# Patient Record
Sex: Male | Born: 1960 | Race: White | Hispanic: No | Marital: Married
Health system: Midwestern US, Academic
[De-identification: ages and names within clinical notes are randomized; demographics above are authoritative.]

---

## 2020-06-26 IMAGING — CR PELVIS
5 series · 5 of 5 positions shown · non-contrast
Comparison: none

[l-spine ap]
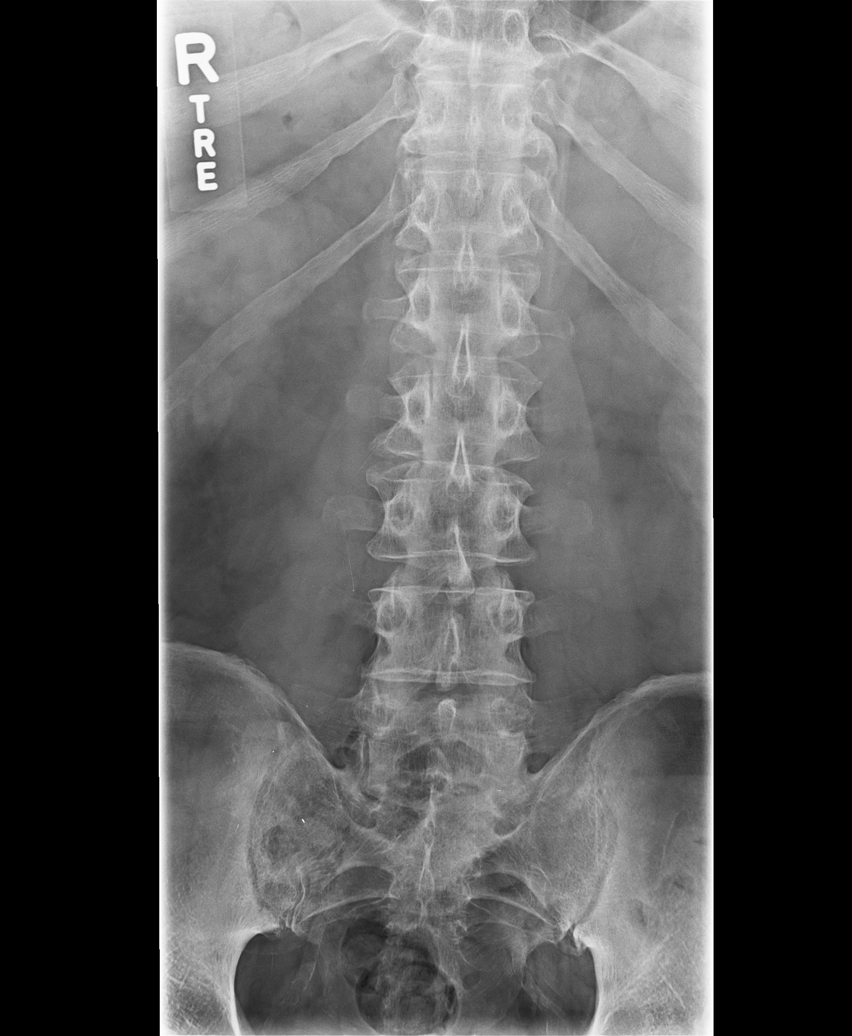

[l-spine obl (1 of 2)]
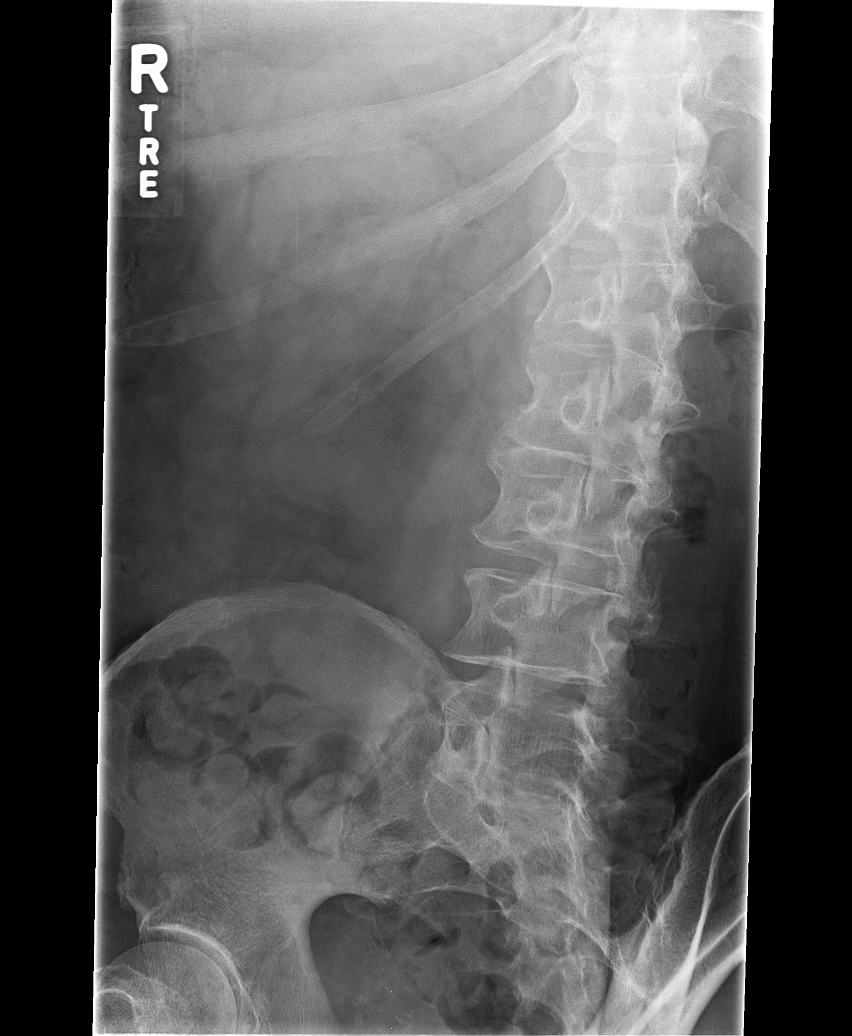

[l-spine obl (2 of 2)]
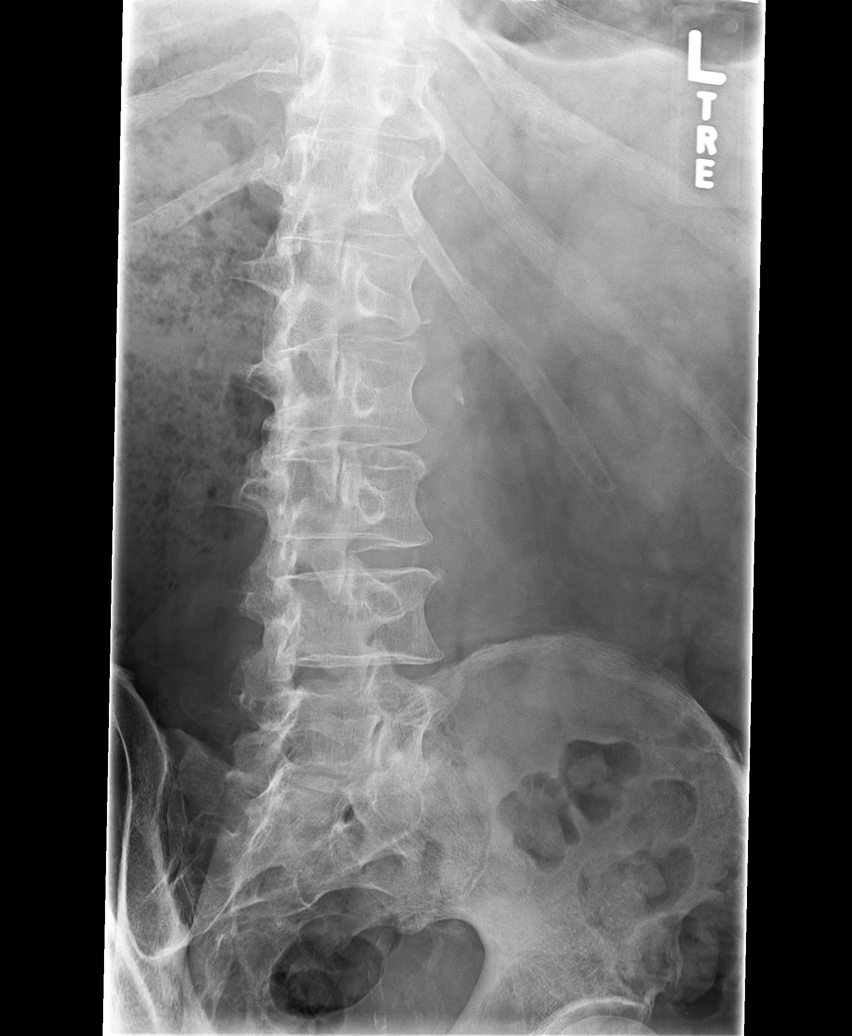

[l-spine lat]
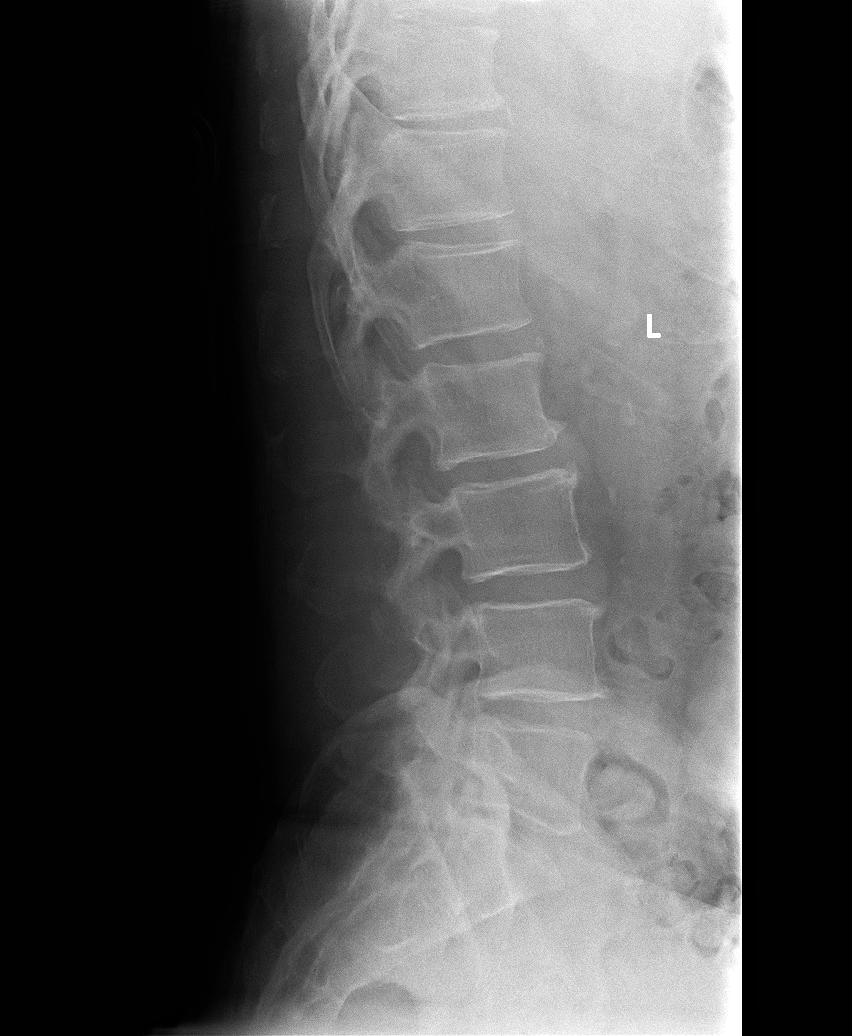

[l-spine l5-s1]
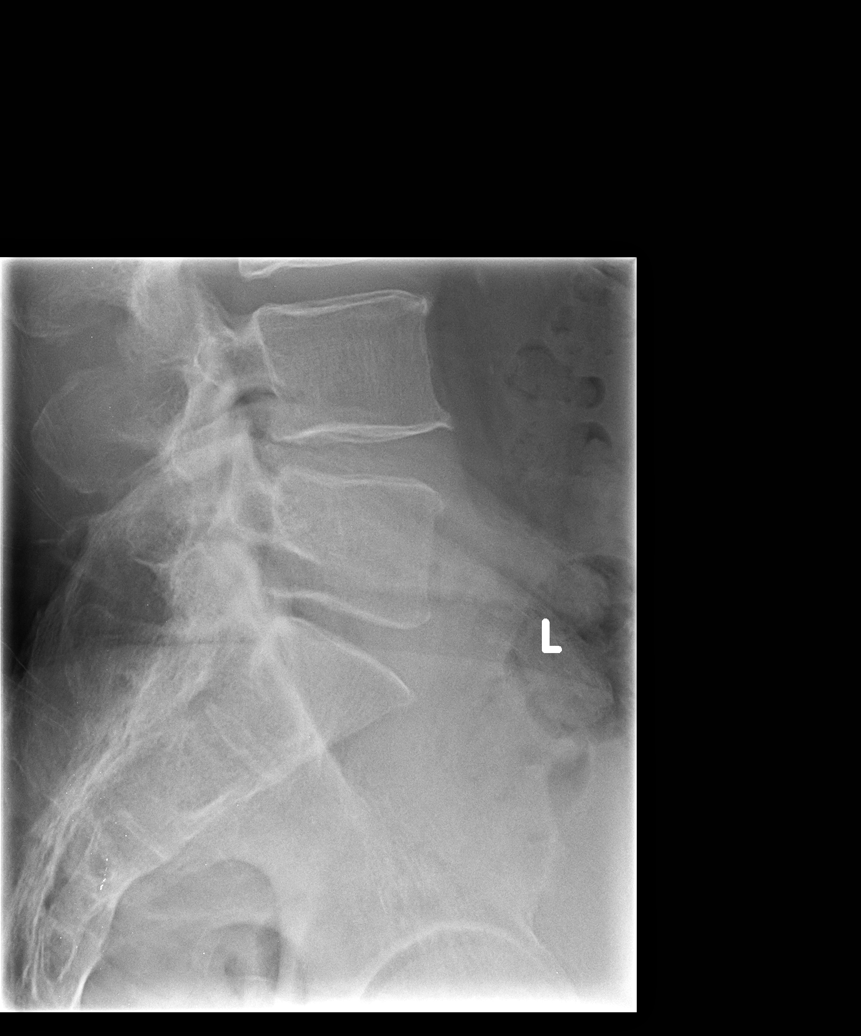

[5 of 5 positions shown; findings below may reference images not displayed]

EXAM

Lumbar spine series

INDICATION

low back pain
LBP W/O INJURY. TB

FINDINGS

Five views of the lumbar spine were obtained, including obliques.

Lumbar vertebral body heights and alignment are normal.

There is mild disc space narrowing at L5-S1.

There are small osteophytes of vertebral body endplates of L 2 through L5.

There is mild-to-moderate narrowing of the facet joint spaces bilaterally, most notably at L4-5 and

IMPRESSION

There is multilevel lumbar spine spondylosis. No acute appearing abnormality is identified.

Tech Notes:

LBP W/O INJURY. TB

## 2020-06-29 IMAGING — MR L-spine^Routine
4 series · 25 of 25 positions shown · non-contrast
Comparison: none

[Series 2: T2 · sagittal · 4.0mm · 0.62mm/px · 4 of 4 slices shown (1 of 2)]
[im 1/4]
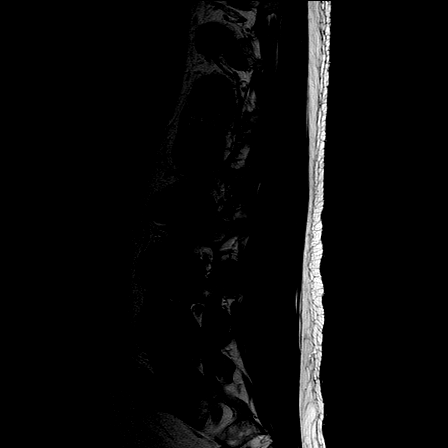
[im 2/4]
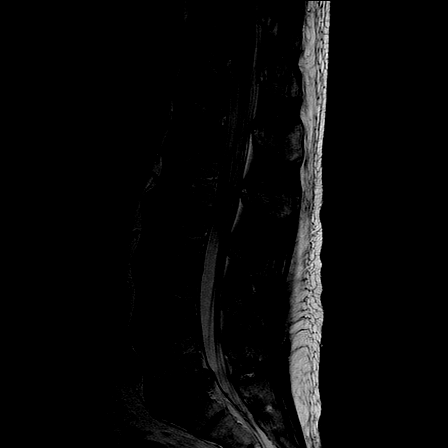
[im 3/4]
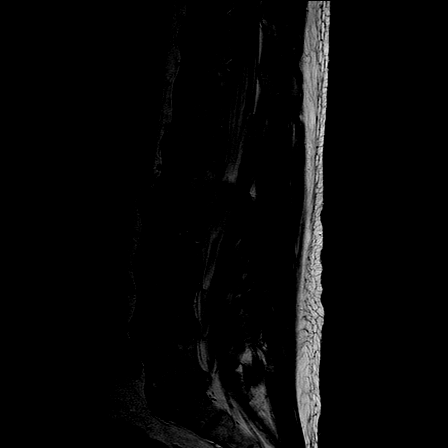
[im 4/4]
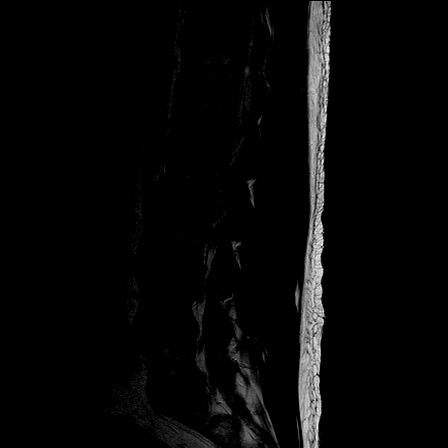

[Series 3: T1 · sagittal · 4.0mm · 0.73mm/px · 5 of 5 slices shown (1 of 2)]
[im 1/5]
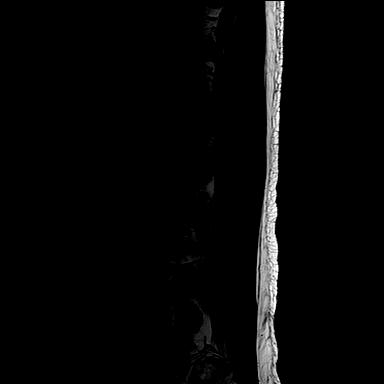
[im 2/5]
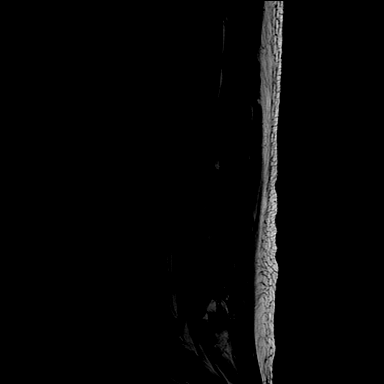
[im 3/5]
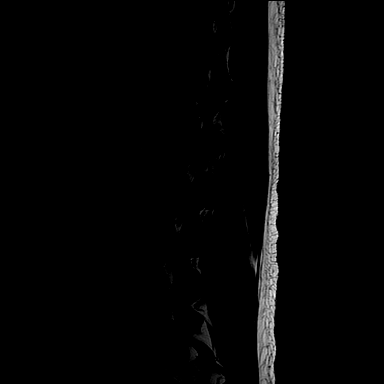
[im 4/5]
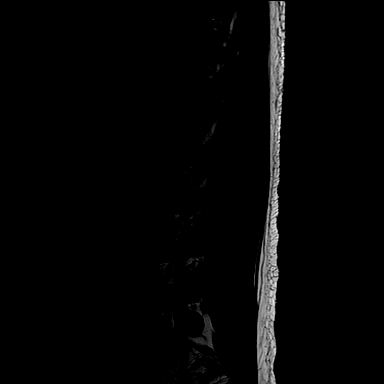
[im 5/5]
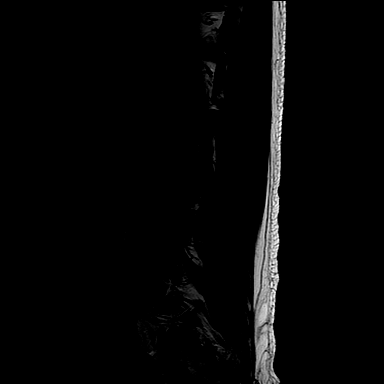

[Series 5: T2 · axial · 4.5mm · 0.49mm/px · z∈[-160,+1]mm · 10 of 10 slices shown (2 of 2)]
[im 1/10]
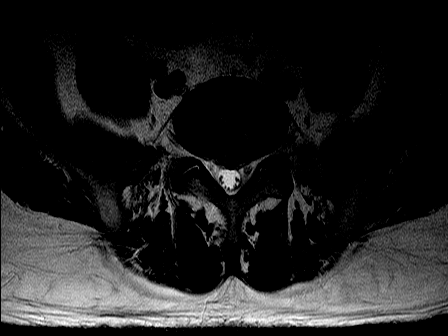
[im 2/10]
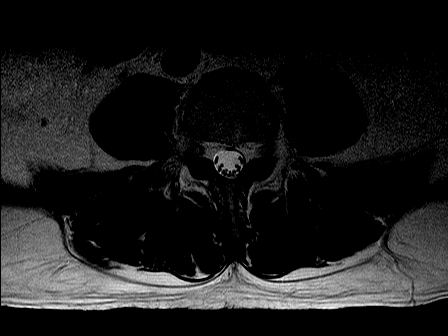
[im 3/10]
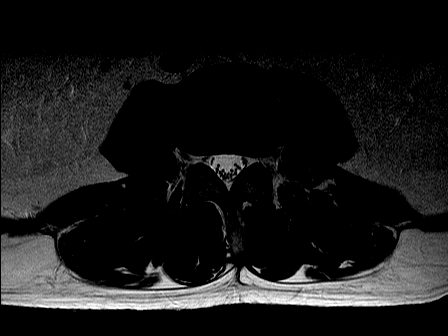
[im 4/10]
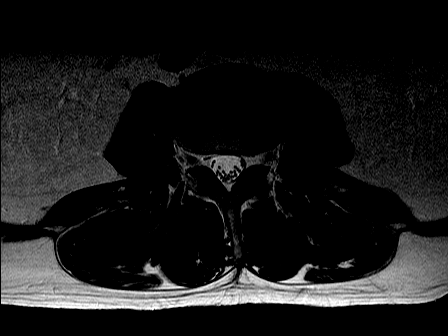
[im 5/10]
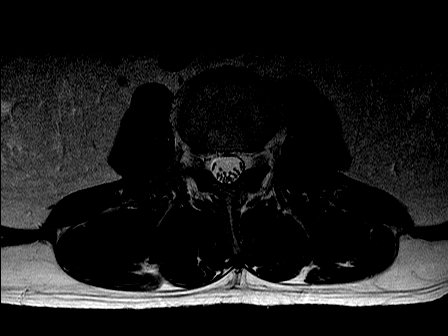
[im 6/10]
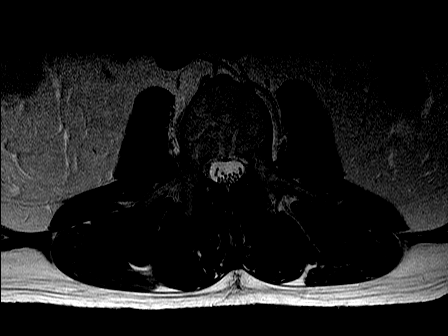
[im 7/10]
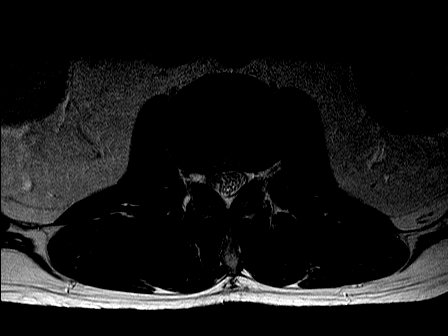
[im 8/10]
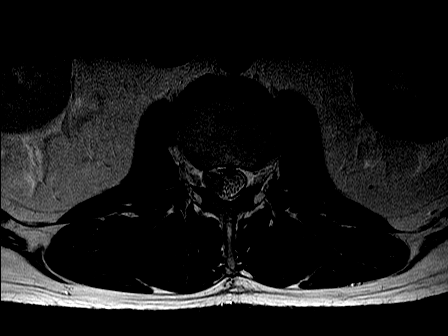
[im 9/10]
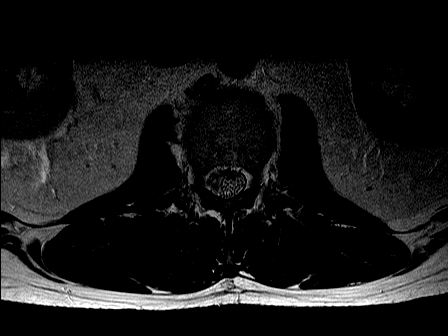
[im 10/10]
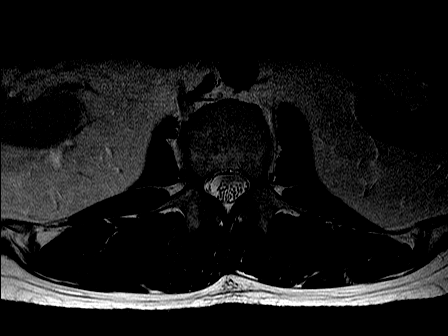

[Series 6: T1 · axial · 4.5mm · 0.86mm/px · z∈[-155,+24]mm · 6 of 6 slices shown (2 of 2)]
[im 1/6]
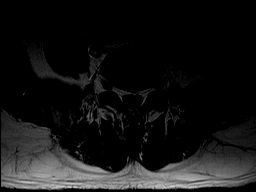
[im 2/6]
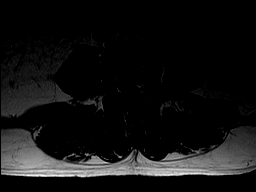
[im 3/6]
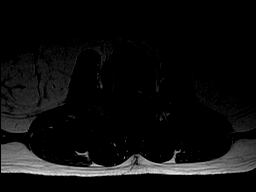
[im 4/6]
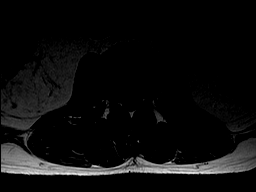
[im 5/6]
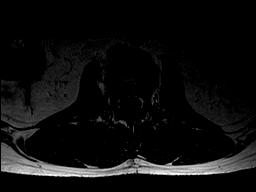
[im 6/6]
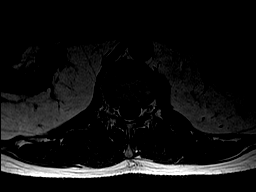

[25 of 25 positions shown; findings below may reference images not displayed]

DIAGNOSTIC STUDIES

EXAM

MRI lumbar spine without contrast.

INDICATION

spinal stenosis
LOW BACK PAIN WITH RADIATION DOWN RT LEG, NUMBNESS TO RT KNEE.  RG

TECHNIQUE

Sagittal axial images were obtained with variable T1 and T2 weighting.

COMPARISONS

None available

FINDINGS

Conus medullaris is normal in signal intensity and location. The intervertebral discs at T12-L1 and
L1-2 are normal. There is disc bulging at L2-3 without central canal or neural foraminal stenosis.

Just above the L2-3 disc level there is a right-sided soft tissue density best seen on image 8
series 2. This measures 1.4 cm. This probably is a extradural and may reflect migrated free disc
fragment versus nerve sheath tumor. Repeat exam with contrast is recommended.

L3-4: Slight disc bulging is noted without central canal or neural foraminal stenosis.

L4-5: There is minimal disc bulging and facet hypertrophy without central canal or neural foraminal
stenosis.

L5-S1: Small posterior midline disc protrusion is seen. This is largely confined to the ventral
epidural fat. Neural foramina well preserved.

IMPRESSION

1.4 cm right sided soft tissue density just above the disc level of L2-3. This likely is extradural
and may reflect a large free disc fragment versus nerve sheath type tumor. Repeat MRI with
gadolinium is suggested.

Small posterior midline disc protrusion at L5-S1 without significant central canal or neural
foraminal stenosis.

Tech Notes:

LOW BACK PAIN WITH RADIATION DOWN RT LEG, NUMBNESS TO RT KNEE.  RG

## 2020-07-03 ENCOUNTER — Encounter: Admit: 2020-07-03 | Discharge: 2020-07-03 | Payer: PRIVATE HEALTH INSURANCE

## 2020-07-05 ENCOUNTER — Encounter: Admit: 2020-07-05 | Discharge: 2020-07-05 | Payer: PRIVATE HEALTH INSURANCE

## 2020-07-05 NOTE — Telephone Encounter
Navigation Intake Assessment Document    Patient Name:  Larry Leblanc  DOB:  [birth year]  Insurance:  [employer]     Appointment Info:   Future Appointments   Date Time Provider Department Center   07/12/2020 10:30 AM Pablo Penrose, MD [facility]     Diagnosis & Reason for Visit:  Lumbar mass    Physician Info:    Referring Physician:  Dr Jhonnie Garner    Contact Name & Number:  [phone], [place]    Neurology:  Dr Christin Bach, [facility] Oncologist:  Does not have.    Radiation Oncologist:  Does not have.     PCP:  Dr Christin Bach    Location of Films:  PACS    Location of Pathology:  No biopsy     History of Present Illness:    60 year old male who initially presented with low back pain.  Pain is sharp and constant with numbness down right leg, starting late April.   He is having difficulty walking, but able to walk some once medications started.      Previous injections into back many ears ago.    PE: Weakness in the FHL and loss of reflex in the right ankle.     06/26/20 Lumbar spine series:  Multilevel lumbar spine spondylosis.  No acute abnormality.   06/29/20 MRI:  Mass 1.4 cm  in the right side tissue density just above the disc level of L2-3     Prior Treatment (XRT, Surgery, Chemotherapy):  Pain med    Allergies reviewed and verified with the patient, and documented in Epic:  Yes    PMH: HTN, controlled; DM2, not controlled; on BLOOD THINNER;  Blurred vision resulting in CVA work up (FEB) tonsillectomy ([facility])    Never smoker  Wt 88.5 kg    NEEDS Assessment:    Genetic Counseling:                Nutrition:          Social & Financial:             Spiritual & Emotional:          Physical:          Communication:          Onc Fertility:           Additional Education:         Patient Education                      Topics Discussed               Education Details                  Additional Education Comments

## 2020-07-06 ENCOUNTER — Encounter: Admit: 2020-07-06 | Discharge: 2020-07-06 | Payer: PRIVATE HEALTH INSURANCE

## 2020-07-06 DIAGNOSIS — R69 Illness, unspecified: Secondary | ICD-10-CM

## 2020-07-07 IMAGING — MR L-spine^LUMBAR BLOCK
5 series · 42 of 48 positions shown · IV contrast (with contrast)
Comparison: none

[Series 2: T2 · sagittal · 4.0mm · 0.62mm/px · 7 of 9 slices shown (1 of 2)]
[im 1/9]
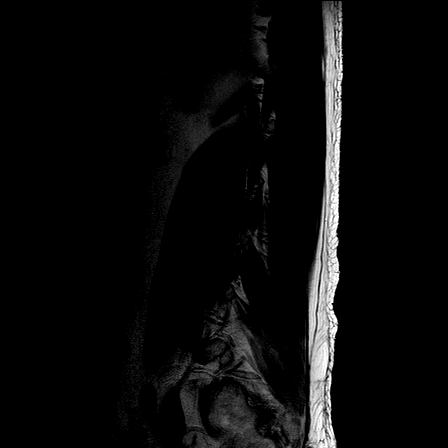
[im 2/9]
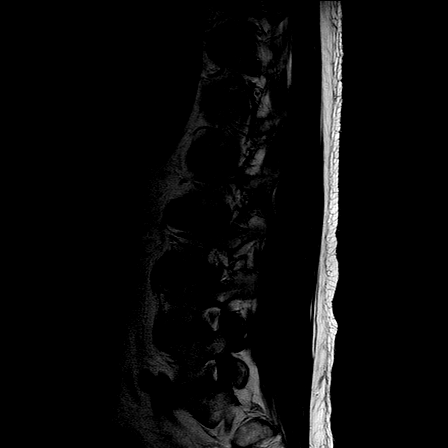
[im 3/9]
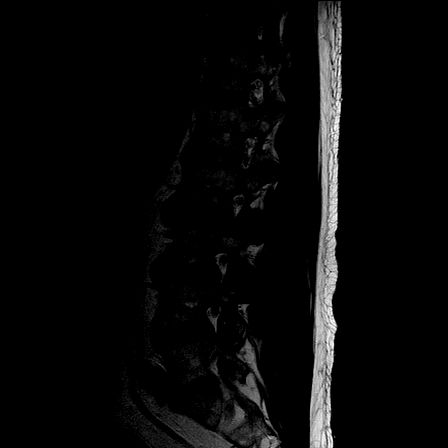
[im 5/9]
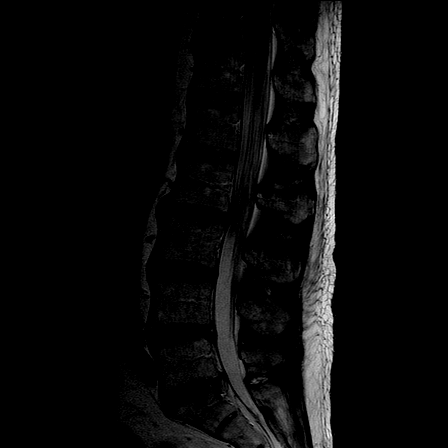
[im 6/9]
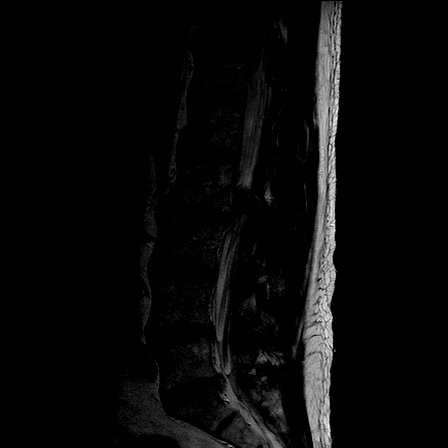
[im 7/9]
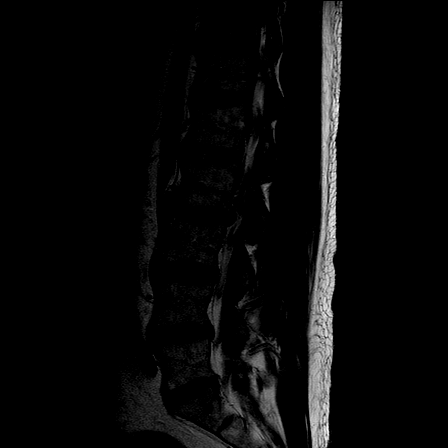
[im 9/9]
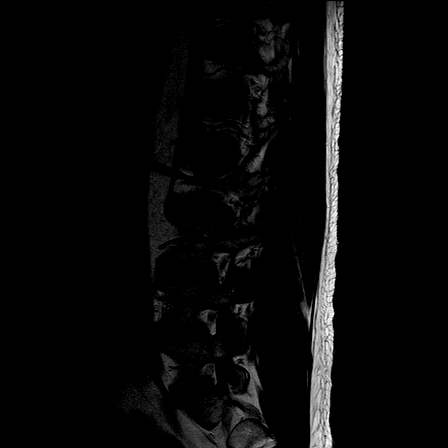

[Series 5: T2 · axial · 4.5mm · 0.49mm/px · z∈[-196,-8]mm · 14 of 20 slices shown (2 of 2)]
[im 1/20]
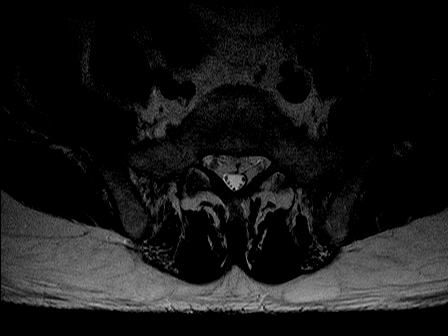
[im 2/20]
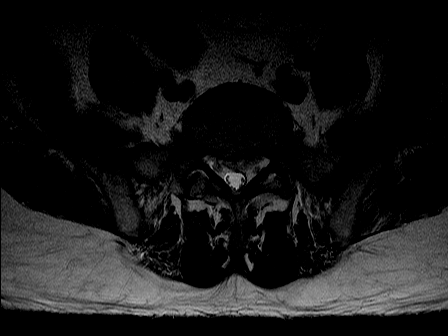
[im 3/20]
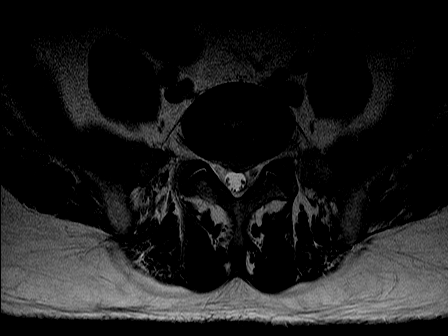
[im 5/20]
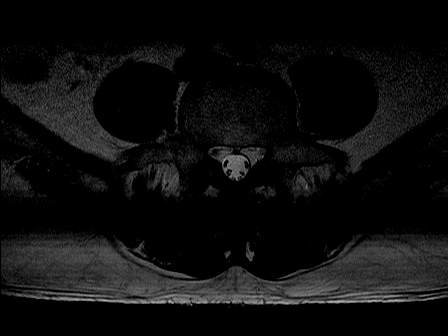
[im 6/20]
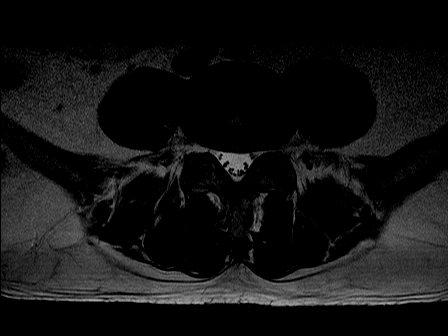
[im 8/20]
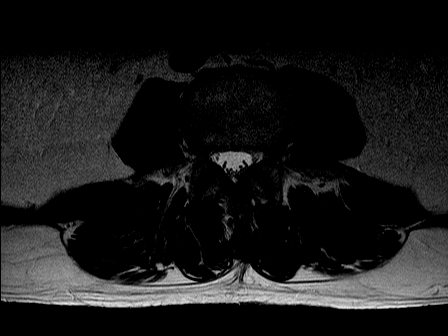
[im 9/20]
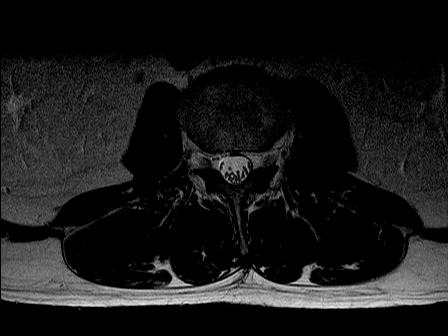
[im 11/20]
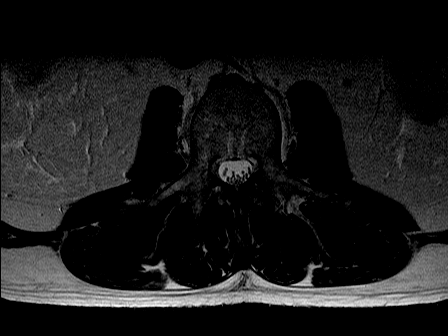
[im 12/20]
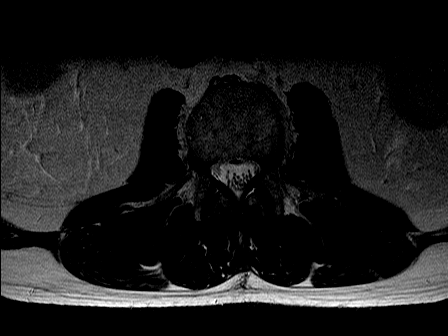
[im 14/20]
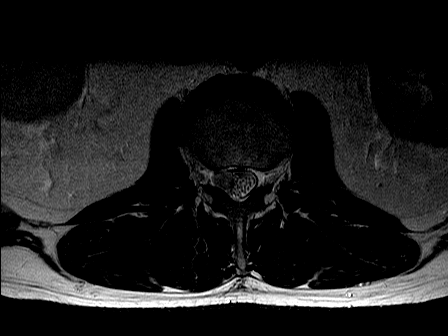
[im 15/20]
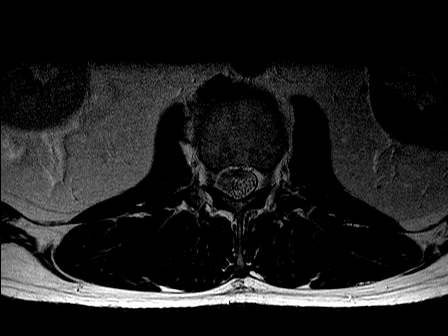
[im 17/20]
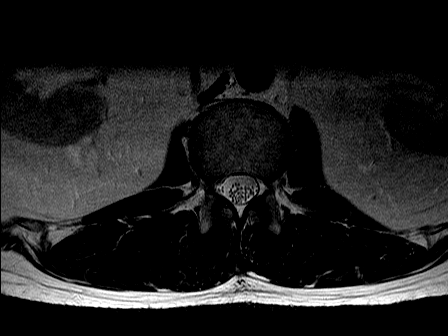
[im 18/20]
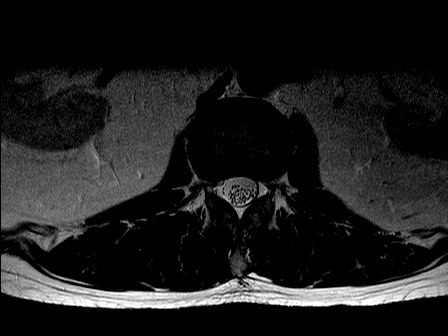
[im 20/20]
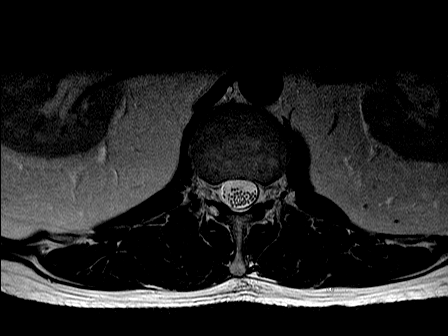

[Series 6: T1 · axial · 4.5mm · 0.86mm/px · z∈[-137,-19]mm · 6 of 9 slices shown]
[im 1/9]
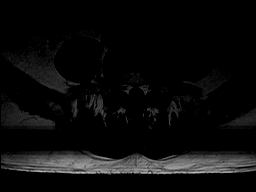
[im 2/9]
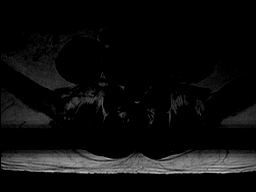
[im 4/9]
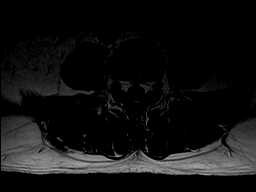
[im 5/9]
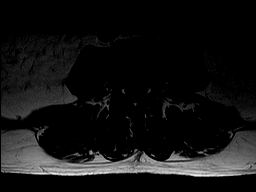
[im 7/9]
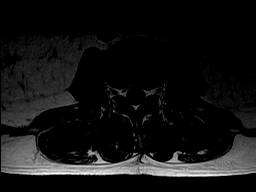
[im 9/9]
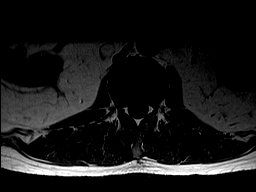

[Series 7: T1 fat-sat post-contrast · sagittal · 4.0mm · 0.88mm/px · 7 of 10 slices shown (1 of 2)]
[im 1/10]
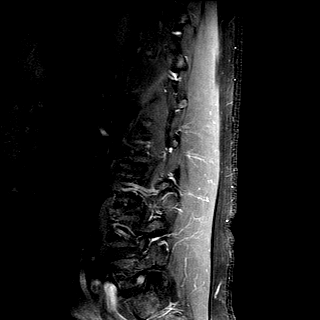
[im 2/10]
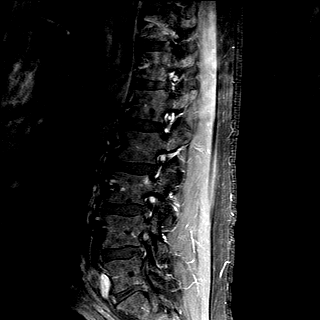
[im 4/10]
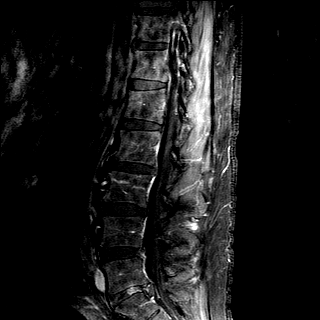
[im 5/10]
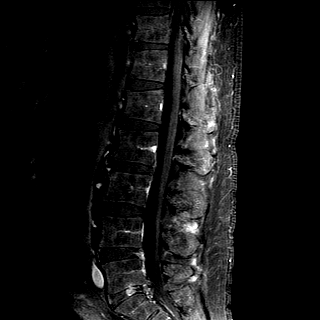
[im 7/10]
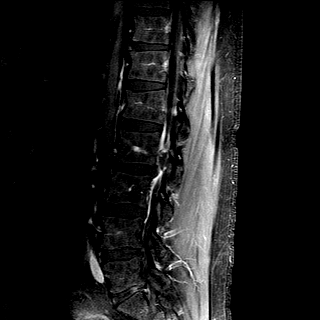
[im 8/10]
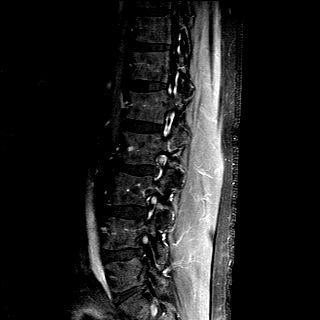
[im 10/10]
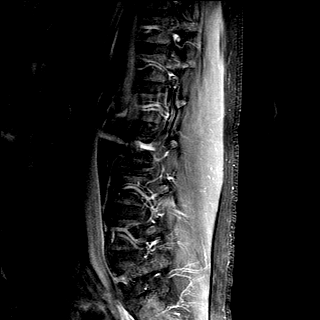

[Series 8: T1 fat-sat post-contrast · axial · 5.0mm · 0.86mm/px · z∈[-199,-6]mm · 8 of 20 slices shown (2 of 2)]
[im 1/20]
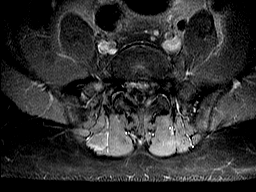
[im 3/20]
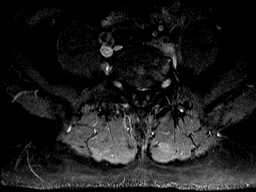
[im 6/20]
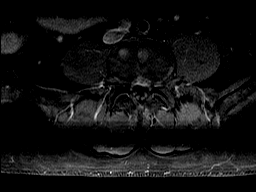
[im 9/20]
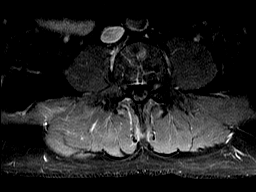
[im 11/20]
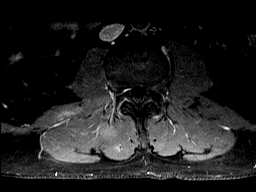
[im 14/20]
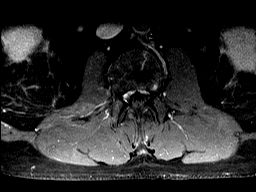
[im 17/20]
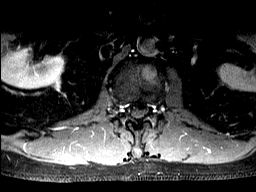
[im 20/20]
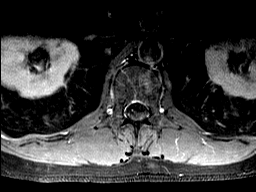

[42 of 48 positions shown; findings below may reference images not displayed]

DIAGNOSTIC STUDIES

EXAM

MRI lumbar spine with and without contrast:

INDICATION

Abnormal noncontrast MRI.

TECHNIQUE

Routine imaging sequences were obtained on a GE system. Pre and post contrast imaging was
performed.

COMPARISONS

06/29/2020.

FINDINGS

No significant scoliosis. The marrow signal is slightly heterogeneous but is likely within normal
limits. There are several small hemangiomas. No significant marrow edema. No acute displaced
fracture or dislocation. The conus is located at the level of T12/L1.

L1-L2: No significant disc bulge or protrusion.

L2-L3: Mild diffuse disc bulge with moderate loss of disc height. There is a 0.9 x 1.0 x 1.6 cm
nonenhancing mass within the right lateral recess. Severe narrowing of the right lateral recess.
Mild ligamentum flava hypertrophy. Minimal bilateral neural foramen stenosis.

L3-L4: Mild diffuse disc bulge with mild ligamentum flava hypertrophy and facet arthropathy. No
central canal stenosis. Minimal neural foramen stenosis on the left.

L4-L5: Mild diffuse disc bulge with mild ligamentum flava hypertrophy and facet arthropathy. Mild
central canal stenosis. Minimal neural foramen stenosis on the left.

L5-S1: Left paracentral focal disc protrusion with moderate central canal stenosis. There is mild
ligamentum flava hypertrophy and facet arthropathy. Minimal neural foramen stenosis on the right.

No enlarged retroperitoneal nodes. No paraspinal fluid collections. Paravertebral soft tissues are
not remarkable.

IMPRESSION

L2-L3: There is a 0.9 x 1.0 x 1.6 cm disc fragment posterior to the L2 vertebral body with severe
narrowing of the right lateral recess.

L5-S1: Left paracentral focal disc protrusion with moderate central canal stenosis.

Follow-up with a neurosurgical consultation is recommended.

Tech Notes:

FU ARE L2 FROM PREVIOUS MRI.  HX LOW BACK PAIN.  15 ML GADAVIST RG

## 2020-07-09 ENCOUNTER — Encounter: Admit: 2020-07-09 | Discharge: 2020-07-09 | Payer: PRIVATE HEALTH INSURANCE

## 2020-07-09 DIAGNOSIS — R69 Illness, unspecified: Secondary | ICD-10-CM

## 2020-07-11 ENCOUNTER — Encounter: Admit: 2020-07-11 | Discharge: 2020-07-11 | Payer: PRIVATE HEALTH INSURANCE

## 2020-07-11 DIAGNOSIS — M545 Chronic low back pain, unspecified back pain laterality, unspecified whether sciatica present: Secondary | ICD-10-CM

## 2020-07-12 ENCOUNTER — Encounter: Admit: 2020-07-12 | Discharge: 2020-07-12 | Payer: PRIVATE HEALTH INSURANCE

## 2020-07-12 ENCOUNTER — Ambulatory Visit: Admit: 2020-07-12 | Discharge: 2020-07-12 | Payer: PRIVATE HEALTH INSURANCE

## 2020-07-12 DIAGNOSIS — M5116 Intervertebral disc disorders with radiculopathy, lumbar region: Secondary | ICD-10-CM

## 2020-07-12 DIAGNOSIS — I10 Essential (primary) hypertension: Secondary | ICD-10-CM

## 2020-07-12 DIAGNOSIS — E119 Type 2 diabetes mellitus without complications: Secondary | ICD-10-CM

## 2020-07-12 DIAGNOSIS — M545 Chronic low back pain, unspecified back pain laterality, unspecified whether sciatica present: Secondary | ICD-10-CM

## 2020-07-12 NOTE — Progress Notes
Subjective:       History of Present Illness  Samier Jaco is a 60 y.o. male.  He presents with chief complaint of right-sided low back pain which radiates into his right lower extremity in approximate L2 dermatomal pattern.  This pain is worse with standing/walking and improved with recumbency.  This has been going on for little over a month and is failed nonoperative management pain medications, gabapentin, and anti-inflammatories.  His symptoms have been getting progressively worse.             Objective:         ? aspirin EC 81 mg tablet Take 81 mg by mouth daily.   ? atorvastatin (LIPITOR) 80 mg tablet    ? clopiDOGrel (PLAVIX) 75 mg tablet    ? empagliflozin (JARDIANCE) 25 mg tablet    ? FLUoxetine (PROZAC) 20 mg capsule Take 20 mg by mouth daily.   ? gabapentin (NEURONTIN) 100 mg capsule    ? HYDROcodone/acetaminophen (NORCO) 5/325 mg tablet    ? lisinopriL (ZESTRIL) 20 mg tablet    ? metFORMIN (GLUCOPHAGE) 1,000 mg tablet Take 1,000 mg by mouth.   ? semaglutide (OZEMPIC) 0.25 mg or 0.5 mg(2 mg/1.5 mL) injection PEN      Vitals:    07/12/20 1117   BP: (!) 136/94   BP Source: Arm, Right Upper   Pulse: 97   Temp: 36.8 ?C (98.2 ?F)   SpO2: 100%   PainSc: Five   Weight: 88.5 kg (195 lb)   Height: 177.8 cm (5' 10)     Body mass index is 27.98 kg/m?Marland Kitchen     Physical Exam  Vitals and nursing note reviewed.   Constitutional:       Appearance: Normal appearance.   HENT:      Head: Normocephalic and atraumatic.   Pulmonary:      Effort: Pulmonary effort is normal.   Musculoskeletal:      Cervical back: Normal.      Thoracic back: Normal.      Lumbar back: No tenderness.   Neurological:      General: No focal deficit present.      Mental Status: He is alert and oriented to person, place, and time.      Sensory: Sensation is intact.      Motor: Motor function is intact.      Gait: Gait abnormal (Antalgic gait).      Deep Tendon Reflexes: Reflexes are normal and symmetric.   Psychiatric:         Mood and Affect: Mood normal.         Behavior: Behavior normal.         Thought Content: Thought content normal.         Judgment: Judgment normal.     I independently reviewed recent MRIs of the lumbar spine which demonstrate a large right-sided L2-3 disc herniation with superior extrusion.  This is severely impinging the right L2 nerve root.  This fragment appears to be sequestered.  I also independently reviewed standing lumbar films demonstrate normal alignment without abnormal motion on flexion/extension views.         Assessment and Plan:     Patient has a large right L2-3 disc herniation that has failed nonoperative management.  I am recommending MIS right L2-3 microdiscectomy.  Patient will need to discontinue his Plavix and ideally his aspirin for this.  Surgical procedure, risk, benefits, and alternatives were discussed with the patient in detail.

## 2020-07-19 ENCOUNTER — Ambulatory Visit: Admit: 2020-07-19 | Discharge: 2020-07-19 | Payer: PRIVATE HEALTH INSURANCE

## 2020-07-19 ENCOUNTER — Encounter: Admit: 2020-07-19 | Discharge: 2020-07-19 | Payer: PRIVATE HEALTH INSURANCE

## 2020-07-19 DIAGNOSIS — M5116 Intervertebral disc disorders with radiculopathy, lumbar region: Secondary | ICD-10-CM

## 2020-07-26 ENCOUNTER — Encounter: Admit: 2020-07-26 | Discharge: 2020-07-26 | Payer: PRIVATE HEALTH INSURANCE

## 2020-07-26 NOTE — Telephone Encounter
Received email from precert that peer to peer is required for upcoming microdiscectomy.  RN called [facility] with reference number [id number] and spoke with Joni Reining.  Scheduled p2p for Dr. Earlene Plater with Dr. Christain Sacramento for 07/31/2020, 9:20am CST.  Dr. Sedonia Small will call PSR Danielle at [phone]; gave main spine center number as a second option to contact Dr. Earlene Plater, if necessary.  RN will email Danielle & Dr. Earlene Plater to update.

## 2020-08-04 ENCOUNTER — Encounter: Admit: 2020-08-04 | Discharge: 2020-08-04 | Payer: PRIVATE HEALTH INSURANCE

## 2020-08-04 ENCOUNTER — Ambulatory Visit: Admit: 2020-08-04 | Discharge: 2020-08-04 | Payer: PRIVATE HEALTH INSURANCE

## 2020-08-04 DIAGNOSIS — G459 Transient cerebral ischemic attack, unspecified: Secondary | ICD-10-CM

## 2020-08-04 DIAGNOSIS — E785 Hyperlipidemia, unspecified: Secondary | ICD-10-CM

## 2020-08-04 DIAGNOSIS — Z01818 Encounter for other preprocedural examination: Secondary | ICD-10-CM

## 2020-08-04 DIAGNOSIS — I10 Essential (primary) hypertension: Secondary | ICD-10-CM

## 2020-08-04 DIAGNOSIS — K449 Diaphragmatic hernia without obstruction or gangrene: Secondary | ICD-10-CM

## 2020-08-04 DIAGNOSIS — Z8659 Personal history of other mental and behavioral disorders: Secondary | ICD-10-CM

## 2020-08-04 DIAGNOSIS — E119 Type 2 diabetes mellitus without complications: Secondary | ICD-10-CM

## 2020-08-04 LAB — BASIC METABOLIC PANEL
ANION GAP: 13 pg — ABNORMAL HIGH (ref 3–12)
BLD UREA NITROGEN: 19 mg/dL (ref 7–25)
CALCIUM: 9.9 mg/dL (ref 8.5–10.6)
CHLORIDE: 100 MMOL/L (ref 98–110)
CO2: 27 MMOL/L (ref 21–30)
CREATININE: 1.2 mg/dL — ABNORMAL HIGH (ref 0.4–1.24)
EGFR: >60 mL/min (ref >60)
GLUCOSE,PANEL: 233 mg/dL — ABNORMAL HIGH (ref 70–100)
POTASSIUM: 4.9 MMOL/L (ref 3.5–5.1)
SODIUM: 140 MMOL/L (ref 137–147)

## 2020-08-04 LAB — CBC AND DIFF
ABSOLUTE BASO COUNT: 0 K/UL (ref 0–0.20)
ABSOLUTE EOS COUNT: 0.2 K/UL (ref 0–0.45)
ABSOLUTE LYMPH COUNT: 1.9 K/UL (ref 1.0–4.8)
ABSOLUTE MONO COUNT: 0.4 K/UL (ref 0–0.80)
ABSOLUTE NEUTROPHIL: 5.8 K/UL (ref 1.8–7.0)
BASOPHILS %: 1 % (ref 0–2)
EOSINOPHILS %: 2 % (ref 0–5)
LYMPHOCYTES %: 23 % — ABNORMAL LOW (ref 24–44)
MONOCYTES %: 6 % (ref 4–12)
WBC COUNT: 8.5 K/UL (ref 4.5–11.0)

## 2020-08-25 ENCOUNTER — Ambulatory Visit: Admit: 2020-08-25 | Discharge: 2020-08-25 | Payer: PRIVATE HEALTH INSURANCE

## 2020-08-25 ENCOUNTER — Encounter: Admit: 2020-08-25 | Discharge: 2020-08-25 | Payer: PRIVATE HEALTH INSURANCE

## 2020-08-25 DIAGNOSIS — I10 Essential (primary) hypertension: Secondary | ICD-10-CM

## 2020-08-25 DIAGNOSIS — G459 Transient cerebral ischemic attack, unspecified: Secondary | ICD-10-CM

## 2020-08-25 DIAGNOSIS — K449 Diaphragmatic hernia without obstruction or gangrene: Secondary | ICD-10-CM

## 2020-08-25 DIAGNOSIS — Z8659 Personal history of other mental and behavioral disorders: Secondary | ICD-10-CM

## 2020-08-25 DIAGNOSIS — E119 Type 2 diabetes mellitus without complications: Secondary | ICD-10-CM

## 2020-08-25 DIAGNOSIS — E785 Hyperlipidemia, unspecified: Secondary | ICD-10-CM

## 2020-08-25 MED ORDER — ONDANSETRON HCL (PF) 4 MG/2 ML IJ SOLN
INTRAVENOUS | 0 refills | Status: DC
Start: 2020-08-25 — End: 2020-08-25
  Administered 2020-08-25: 16:00:00 4 mg via INTRAVENOUS

## 2020-08-25 MED ORDER — PHENYLEPHRINE HCL IN 0.9% NACL 1 MG/10 ML (100 MCG/ML) IV SYRG
INTRAVENOUS | 0 refills | Status: DC
Start: 2020-08-25 — End: 2020-08-25
  Administered 2020-08-25 (×4): 100 ug via INTRAVENOUS

## 2020-08-25 MED ORDER — CEFAZOLIN 1 GRAM IJ SOLR
INTRAVENOUS | 0 refills | Status: DC
Start: 2020-08-25 — End: 2020-08-25
  Administered 2020-08-25: 14:00:00 2 g via INTRAVENOUS

## 2020-08-25 MED ORDER — ROCURONIUM 10 MG/ML IV SOLN
INTRAVENOUS | 0 refills | Status: DC
Start: 2020-08-25 — End: 2020-08-25
  Administered 2020-08-25: 14:00:00 50 mg via INTRAVENOUS

## 2020-08-25 MED ORDER — LIDOCAINE (PF) 200 MG/10 ML (2 %) IJ SYRG
INTRAVENOUS | 0 refills | Status: DC
Start: 2020-08-25 — End: 2020-08-25
  Administered 2020-08-25: 14:00:00 40 mg via INTRAVENOUS

## 2020-08-25 MED ORDER — PHENYLEPHRINE HCL IN 0.9% NACL 1MG/10ML INFUSION (AN)(OSM)
INTRAVENOUS | 0 refills | Status: DC
Start: 2020-08-25 — End: 2020-08-25
  Administered 2020-08-25: 15:00:00 .2 ug/kg/min via INTRAVENOUS

## 2020-08-25 MED ORDER — PROPOFOL INJ 10 MG/ML IV VIAL
INTRAVENOUS | 0 refills | Status: DC
Start: 2020-08-25 — End: 2020-08-25
  Administered 2020-08-25: 14:00:00 200 mg via INTRAVENOUS

## 2020-08-25 MED ORDER — MIDAZOLAM 1 MG/ML IJ SOLN
INTRAVENOUS | 0 refills | Status: DC
Start: 2020-08-25 — End: 2020-08-25
  Administered 2020-08-25: 14:00:00 2 mg via INTRAVENOUS

## 2020-08-25 MED ORDER — ARTIFICIAL TEARS (PF) SINGLE DOSE DROPS GROUP
OPHTHALMIC | 0 refills | Status: DC
Start: 2020-08-25 — End: 2020-08-25
  Administered 2020-08-25: 14:00:00 2 [drp] via OPHTHALMIC

## 2020-08-25 MED ORDER — FENTANYL CITRATE (PF) 50 MCG/ML IJ SOLN
INTRAVENOUS | 0 refills | Status: DC
Start: 2020-08-25 — End: 2020-08-25
  Administered 2020-08-25 (×2): 25 ug via INTRAVENOUS
  Administered 2020-08-25: 14:00:00 50 ug via INTRAVENOUS

## 2020-08-25 MED ORDER — SUGAMMADEX 100 MG/ML IV SOLN
INTRAVENOUS | 0 refills | Status: DC
Start: 2020-08-25 — End: 2020-08-25
  Administered 2020-08-25: 16:00:00 171 mg via INTRAVENOUS

## 2020-08-25 MED ORDER — DEXAMETHASONE SODIUM PHOSPHATE 4 MG/ML IJ SOLN
INTRAVENOUS | 0 refills | Status: DC
Start: 2020-08-25 — End: 2020-08-25
  Administered 2020-08-25: 14:00:00 4 mg via INTRAVENOUS

## 2020-08-25 MED ADMIN — SODIUM CHLORIDE 0.9 % IV SOLP [27838]: 1000 mL | @ 16:00:00 | Stop: 2020-08-25 | NDC 00338004904

## 2020-08-25 MED ADMIN — ACETAMINOPHEN 500 MG PO TAB [102]: 1000 mg | ORAL | @ 14:00:00 | Stop: 2020-08-25 | NDC 00904673061

## 2020-08-25 MED ADMIN — BUPIVACAINE-EPINEPHRINE 0.25 %-1:200,000 IJ SOLN [14983]: 1 mL | INTRAMUSCULAR | @ 15:00:00 | Stop: 2020-08-25 | NDC 00409175250

## 2020-08-25 MED ADMIN — LACTATED RINGERS IV SOLP [4318]: 1000.000 mL | INTRAVENOUS | @ 14:00:00 | Stop: 2020-08-25 | NDC 00338011704

## 2020-08-25 MED ADMIN — THROMBIN (BOVINE) 5,000 UNIT TP SOLR [164515]: 5000 [IU] | TOPICAL | @ 16:00:00 | Stop: 2020-08-25 | NDC 60793021505

## 2020-08-25 MED ADMIN — CEFAZOLIN 1 GRAM IJ SOLR [1445]: 1000 mL | @ 16:00:00 | Stop: 2020-08-25 | NDC 60505614200

## 2020-08-25 MED ADMIN — OXYCODONE 5 MG PO TAB [10814]: 5 mg | ORAL | @ 17:00:00 | Stop: 2020-08-25 | NDC 42858000110

## 2020-08-25 MED FILL — HYDROCODONE-ACETAMINOPHEN 5-325 MG PO TAB: 5/325 mg | ORAL | 7 days supply | Qty: 28 | Fill #1 | Status: CP

## 2020-08-27 ENCOUNTER — Encounter: Admit: 2020-08-27 | Discharge: 2020-08-27 | Payer: PRIVATE HEALTH INSURANCE

## 2020-08-27 DIAGNOSIS — E119 Type 2 diabetes mellitus without complications: Secondary | ICD-10-CM

## 2020-08-27 DIAGNOSIS — E785 Hyperlipidemia, unspecified: Secondary | ICD-10-CM

## 2020-08-27 DIAGNOSIS — I10 Essential (primary) hypertension: Secondary | ICD-10-CM

## 2020-08-27 DIAGNOSIS — Z8659 Personal history of other mental and behavioral disorders: Secondary | ICD-10-CM

## 2020-08-27 DIAGNOSIS — G459 Transient cerebral ischemic attack, unspecified: Secondary | ICD-10-CM

## 2020-08-27 DIAGNOSIS — K449 Diaphragmatic hernia without obstruction or gangrene: Secondary | ICD-10-CM

## 2020-09-11 ENCOUNTER — Ambulatory Visit: Admit: 2020-09-11 | Discharge: 2020-09-11 | Payer: PRIVATE HEALTH INSURANCE

## 2020-09-11 ENCOUNTER — Encounter: Admit: 2020-09-11 | Discharge: 2020-09-11 | Payer: PRIVATE HEALTH INSURANCE

## 2020-09-11 DIAGNOSIS — E119 Type 2 diabetes mellitus without complications: Secondary | ICD-10-CM

## 2020-09-11 DIAGNOSIS — Z8659 Personal history of other mental and behavioral disorders: Secondary | ICD-10-CM

## 2020-09-11 DIAGNOSIS — I10 Essential (primary) hypertension: Secondary | ICD-10-CM

## 2020-09-11 DIAGNOSIS — K449 Diaphragmatic hernia without obstruction or gangrene: Secondary | ICD-10-CM

## 2020-09-11 DIAGNOSIS — G459 Transient cerebral ischemic attack, unspecified: Secondary | ICD-10-CM

## 2020-09-11 DIAGNOSIS — M5116 Intervertebral disc disorders with radiculopathy, lumbar region: Secondary | ICD-10-CM

## 2020-09-11 DIAGNOSIS — E785 Hyperlipidemia, unspecified: Secondary | ICD-10-CM

## 2020-09-11 NOTE — Patient Instructions
1.  Please continue your current wound care regimen.  2.  Please continue your current bowel regimen.  3.  Please follow-up as previously scheduled.

## 2020-09-11 NOTE — Progress Notes
09/11/2020    Patient:             Larry Leblanc  Med Rec #: [id number]  DOB:              [birth year]    Vitals:    09/11/20 1030   BP: (!) 142/85   Pulse: 95   Temp: 36.4 ?C (97.5 ?F)   PainSc: Zero   Weight: 86.2 kg (190 lb)   Height: 177.8 cm (5' 10)          History of Present Illness:  Larry Leblanc is a 60 y.o. male who underwent a minimally invasive right L2/3 microdiscectomy on 08/25/2020. This patient presented with right-sided low back pain radiating to the right lower extremity in an L2 dermatomal pattern, much worse with standing and walking, improved with recumbency. He failed nonoperative management. Imaging was significant for a large extruded disc fragment from a disc herniation at L2/L3 on the right side. Due to these findings, the patient was offered surgery. After discussing risks and benefits, he wished to proceed with surgery.     He is doing well. He is accompanied by his wife for this visit. He is eating and drinking well with no complaints of constipation. He states the pain in his lower extremities has improved. He reports an occasional shock sensation in his lower extremity. He denies any difficulty with urination or bowel movements.     He is no longer using pain medication or the muscle relaxer.     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     Allergies as of 09/11/2020   ? (No Known Allergies)       ? aspirin EC 81 mg tablet Take one tablet by mouth daily. May restart 7 days after surgery on 09/01/20.   ? atorvastatin (LIPITOR) 80 mg tablet Take 80 mg by mouth daily.   ? clopiDOGrel (PLAVIX) 75 mg tablet Take one tablet by mouth daily. May restart 7 days after surgery on 09/01/20.   ? empagliflozin (JARDIANCE) 25 mg tablet Take 25 mg by mouth daily.   ? FLUoxetine (PROZAC) 20 mg capsule Take 20 mg by mouth daily.   ? HYDROcodone/acetaminophen (NORCO) 5/325 mg tablet Take one tablet by mouth every 6 hours as needed.   ? lisinopriL (ZESTRIL) 20 mg tablet Take 20 mg by mouth daily.   ? metFORMIN (GLUCOPHAGE) 500 mg tablet Take 1,000 mg by mouth twice daily after meals.   ? methocarbamoL (ROBAXIN-750) 750 mg tablet Take one tablet by mouth three times daily as needed for Spasms.   ? semaglutide (OZEMPIC) 0.25 mg or 0.5 mg(2 mg/1.5 mL) injection PEN Inject 0.5 mg under the skin every Sunday.        Medical History:   Diagnosis Date   ? DM (diabetes mellitus) (HCC)    ? Essential hypertension    ? Hiatal hernia    ? History of depression    ? Hyperlipidemia    ? TIA (transient ischemic attack)         Surgical History:   Procedure Laterality Date   ? Minimally Invasive Surgery Right Lumbar 2/3 microdiscectomy N/A 08/25/2020    Performed by Pablo Bryant, MD at [facility]   ? HX TONSILLECTOMY          No flowsheet data found.    Incision: His lumbar incision is healing well with Dermabond in place.  Physical Exam:  The patient is alert and oriented.  Affect is normal and appropriate.   Patient answers questions appropriately and is conversant.  Speech is clear and fluent. Speech is normal rate and volume without hoarseness.  Facies is symmetric. EOM intact.  Motor exam is spontaneous and symmetric. His motor strength is equal throughout.  Gait is independent.      Assessment and Plan:  1. Lumbar disc herniation with radiculopathy         Larry Leblanc is a 60 year old male who underwent a minimally invasive right L2/3 microdiscectomy on 08/25/2020.    He is no longer on pain medication. He will continue his current restrictions. He has a follow-up appointment on 10/11/2020 with Dr. Earlene Plater. All of their questions were answered.    1.  Please continue your current wound care regimen.  2.  Please continue your current bowel regimen.  3.  Please follow-up as previously scheduled.      Total Provider Time:    Total time  12 minutes.  Estimated counseling, exam/ history time is 9 minutes via in person visit.      The remainder of the time was spent in EMR review, documentation, coordination or care, and placing orders as detailed above.      Kerrin Mo MD  phone. [phone]  fax. [phone]    ATTESTATION  This visit was documented by [employer] via audio recorded by Kerrin Mo, MD, on July 25th, 2022 at 1:15 PM.

## 2020-10-10 NOTE — Patient Instructions
It was nice to see you today.  Thank you for choosing to visit our clinic.  Your time is important, and if you had to wait today, we do apologize.  Our goal is to run exactly on time.  However, on occasion, we get behind in clinic due to unexpected patient issues.  Thank you for your patience.    General Instructions:  Scheduling:  Our scheduling phone number is [phone].  Appointment Reminders on your cell phone:  Communication preferences can be managed in MyChart to ensure you receive important appointment notifications  How to reach our office:  Please send a MyChart message to the Spine Center (directed to Dr. Davis) or leave a voicemail for the nurse, Kimberly, at [phone].  How to get a medication refill:  Please use the MyChart Refill request or contact your pharmacy directly to request medication refills.  Please allow 72 business hours for request to be completed.    Support for many chronic illnesses is available through [facility] at [email or web address] or [phone].    For help with MyChart:  please call [phone].    For questions on nights, weekends or holidays:  call the Operator at [phone], and ask for the doctor on call for Neurosurgery.    For more information on spinal conditions:  please visit [email or web address]     Again, thank you for coming in today.

## 2020-10-11 ENCOUNTER — Encounter: Admit: 2020-10-11 | Discharge: 2020-10-11 | Payer: PRIVATE HEALTH INSURANCE

## 2020-10-11 ENCOUNTER — Ambulatory Visit: Admit: 2020-10-11 | Discharge: 2020-10-11 | Payer: PRIVATE HEALTH INSURANCE

## 2020-10-11 DIAGNOSIS — E119 Type 2 diabetes mellitus without complications: Secondary | ICD-10-CM

## 2020-10-11 DIAGNOSIS — K449 Diaphragmatic hernia without obstruction or gangrene: Secondary | ICD-10-CM

## 2020-10-11 DIAGNOSIS — I10 Essential (primary) hypertension: Secondary | ICD-10-CM

## 2020-10-11 DIAGNOSIS — G459 Transient cerebral ischemic attack, unspecified: Secondary | ICD-10-CM

## 2020-10-11 DIAGNOSIS — Z9889 Other specified postprocedural states: Secondary | ICD-10-CM

## 2020-10-11 DIAGNOSIS — E785 Hyperlipidemia, unspecified: Secondary | ICD-10-CM

## 2020-10-11 DIAGNOSIS — Z8659 Personal history of other mental and behavioral disorders: Secondary | ICD-10-CM

## 2020-10-11 NOTE — Progress Notes
Subjective:       History of Present Illness  Larry Leblanc is a 60 y.o. male.  He presents for follow-up of his MIS right L2-3 microdiscectomy on 08/25/2020.  He states that his radicular pain is resolved.  He does have occasional low back pain at the end of the day when he has been more active than usual.  This improves with rest.             Objective:          aspirin EC 81 mg tablet Take one tablet by mouth daily. May restart 7 days after surgery on 09/01/20.    atorvastatin (LIPITOR) 80 mg tablet Take 80 mg by mouth daily.    clopiDOGrel (PLAVIX) 75 mg tablet Take one tablet by mouth daily. May restart 7 days after surgery on 09/01/20.    empagliflozin (JARDIANCE) 25 mg tablet Take 25 mg by mouth daily.    FLUoxetine (PROZAC) 20 mg capsule Take 20 mg by mouth daily.    HYDROcodone/acetaminophen (NORCO) 5/325 mg tablet Take one tablet by mouth every 6 hours as needed.    lisinopriL (ZESTRIL) 20 mg tablet Take 20 mg by mouth daily.    metFORMIN (GLUCOPHAGE) 500 mg tablet Take 1,000 mg by mouth twice daily after meals.    methocarbamoL (ROBAXIN-750) 750 mg tablet Take one tablet by mouth three times daily as needed for Spasms.    semaglutide (OZEMPIC) 0.25 mg or 0.5 mg(2 mg/1.5 mL) injection PEN Inject 0.5 mg under the skin every Sunday.     Vitals:    10/11/20 1123   BP: (!) 143/96   Pulse: 116   SpO2: 98%   PainSc: One   Weight: 88.5 kg (195 lb)   Height: 177.8 cm (5\' 10" )     Body mass index is 27.98 kg/m.     Physical Exam  Awake, alert, and oriented x4  Grossly neurologically intact  Incision well-healed       Assessment and Plan:     Patient has recovered well from surgery.  He has no activity restrictions at this time.  I will see him back on an as-needed basis.

## 2020-12-25 ENCOUNTER — Encounter: Admit: 2020-12-25 | Discharge: 2020-12-25 | Payer: PRIVATE HEALTH INSURANCE

## 2021-12-27 ENCOUNTER — Encounter: Admit: 2021-12-27 | Discharge: 2021-12-27 | Payer: PRIVATE HEALTH INSURANCE
